# Patient Record
Sex: Male | Born: 2000 | Hispanic: No | Marital: Single | Smoking: Never smoker
Health system: Southern US, Community
[De-identification: ages and names within clinical notes are randomized; demographics above are authoritative.]

## PROBLEM LIST (undated history)

## (undated) DIAGNOSIS — K5900 Constipation, unspecified: Secondary | ICD-10-CM

---
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
## 2022-05-04 ENCOUNTER — Ambulatory Visit: Payer: Medicaid Other | Admitting: Nurse Practitioner

## 2022-07-16 IMAGING — CR DG ABDOMEN ACUTE W/ 1V CHEST
3 series · 3 of 3 positions shown · non-contrast
Comparison: 04/07/2021

CLINICAL DATA: Abdominal pain

EXAM:
DG ABDOMEN ACUTE WITH 1 VIEW CHEST

[chest pa]
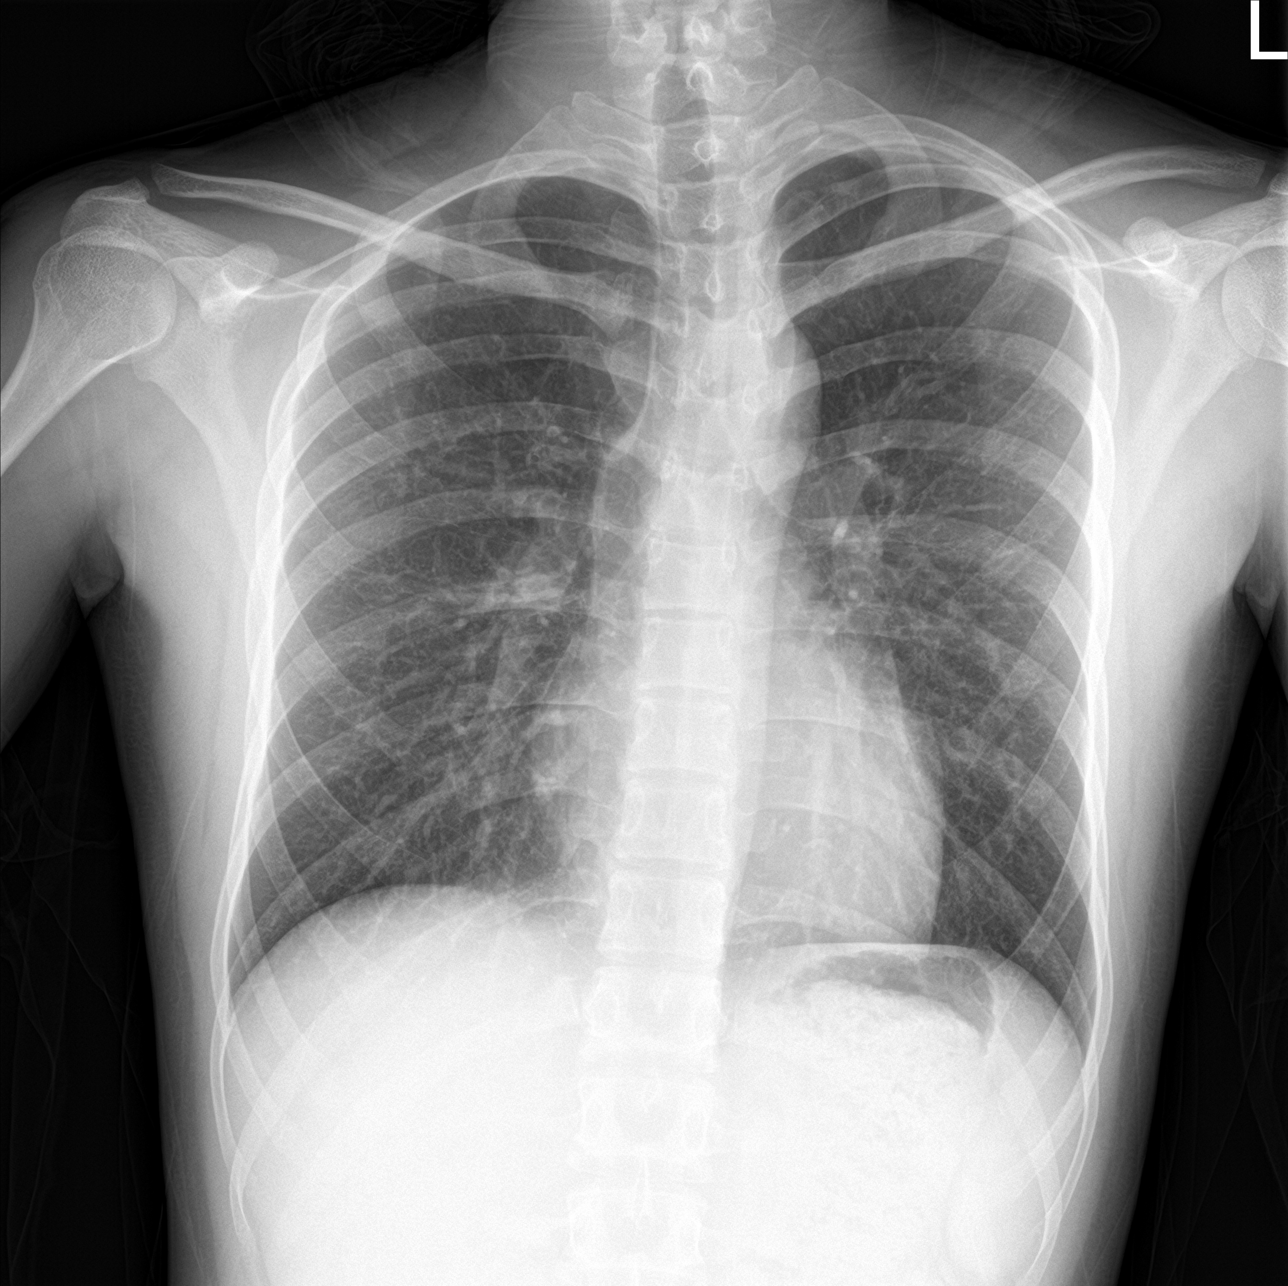

[abdomen erect]
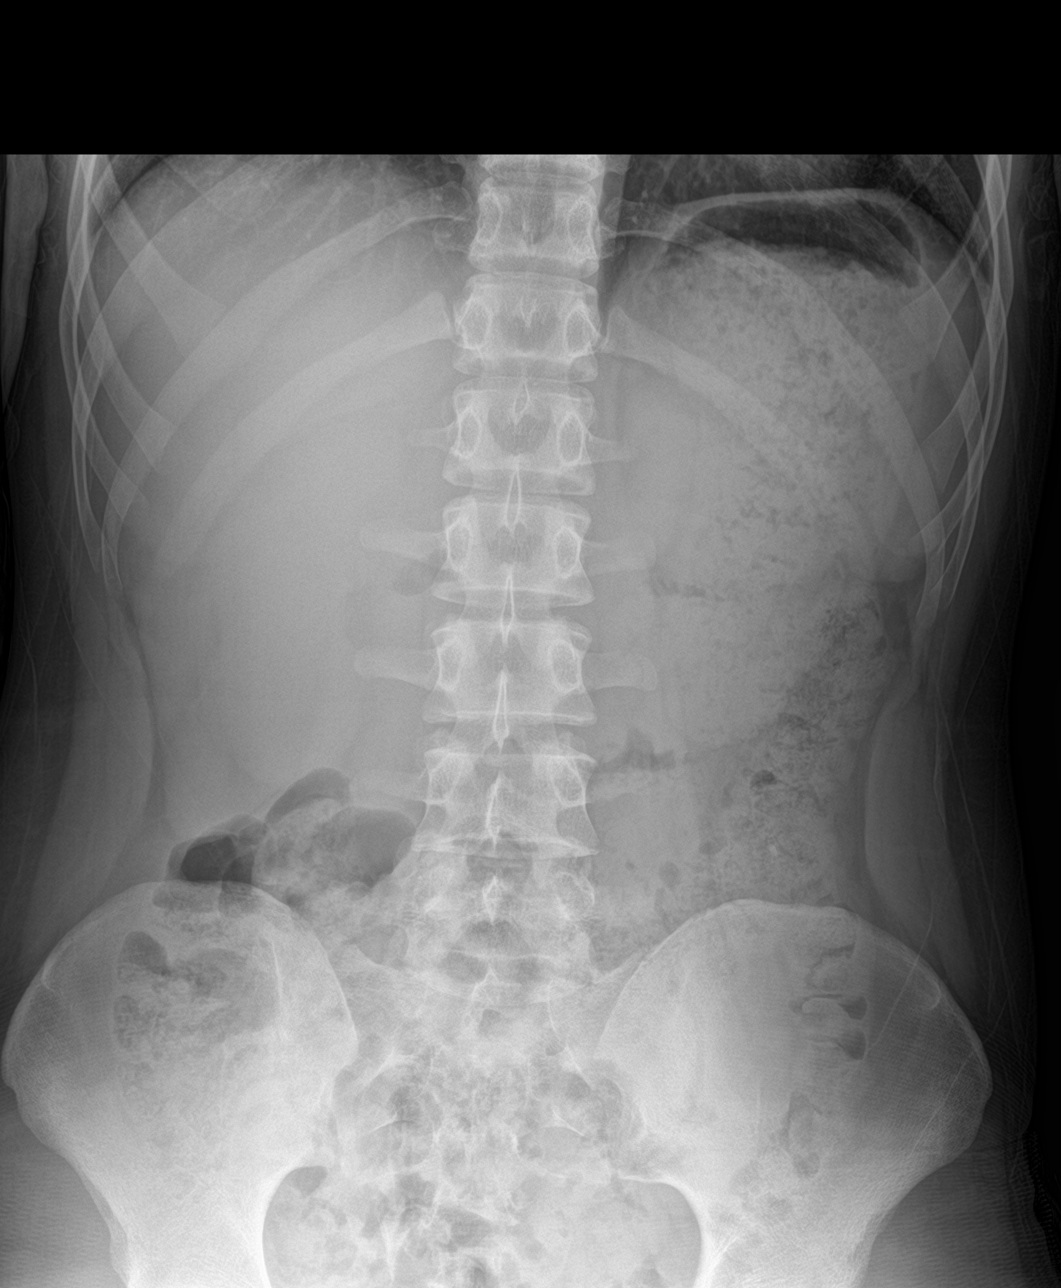

[abdomen supine]
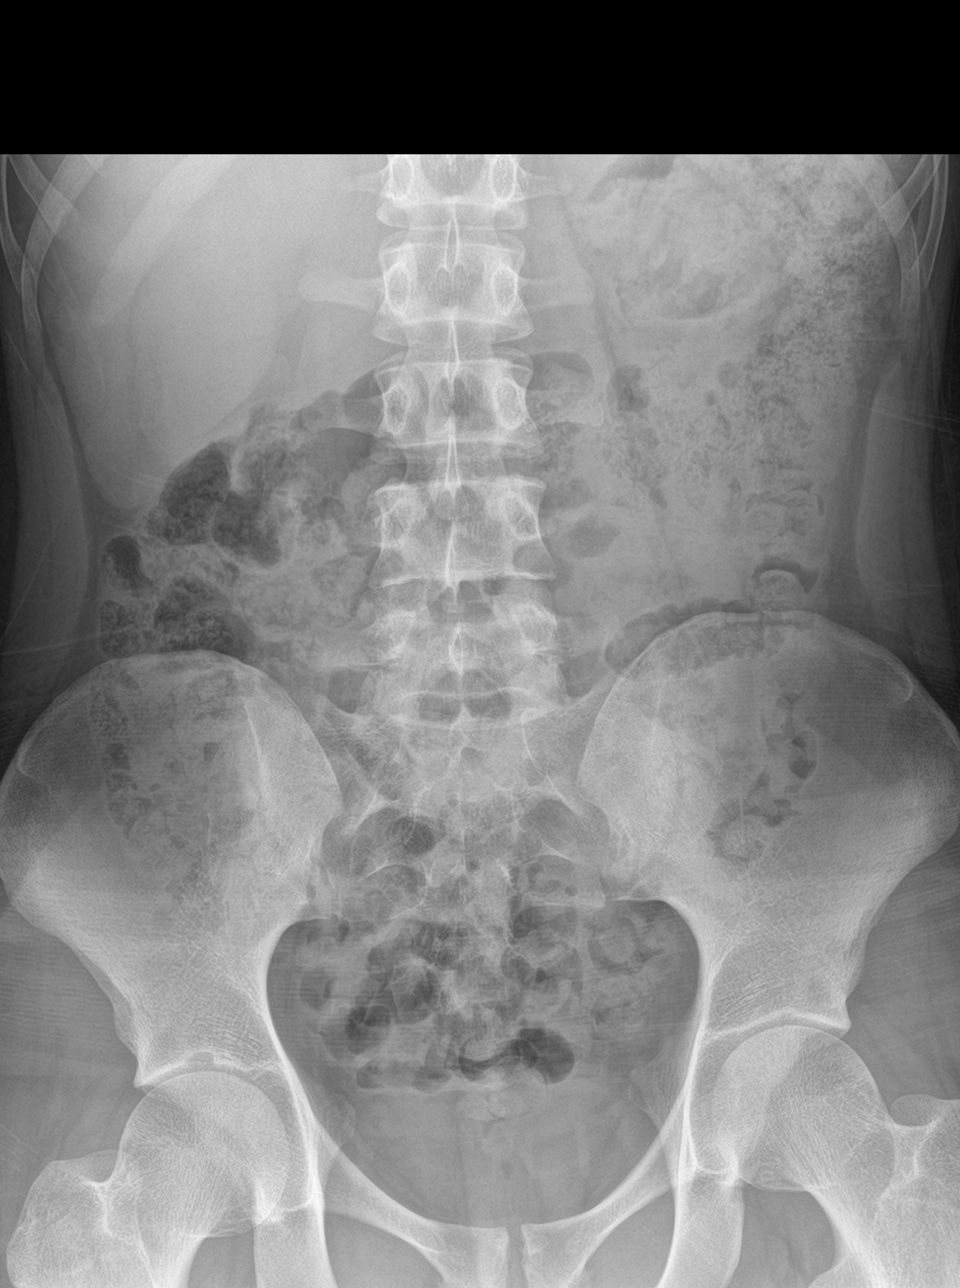

[3 of 3 positions shown; findings below may reference images not displayed]

FINDINGS: Cardiac shadow is within normal limits. The lungs are clear
bilaterally. No bony abnormality is seen.

Fecal material is noted throughout the colon similar to that seen on
the prior exam consistent with a degree of constipation. No free air
is noted. No abnormal mass or abnormal calcifications are noted. No
bony abnormality is seen.
IMPRESSION: Changes of colonic constipation. No other focal abnormality is
noted.
